# Patient Record
Sex: Male | Born: 1965 | Race: White | Hispanic: No | Marital: Married | Smoking: Never smoker
Health system: Southern US, Community
[De-identification: ages and names within clinical notes are randomized; demographics above are authoritative.]

## PROBLEM LIST (undated history)

## (undated) DIAGNOSIS — I10 Essential (primary) hypertension: Secondary | ICD-10-CM

## (undated) DIAGNOSIS — E7800 Pure hypercholesterolemia, unspecified: Secondary | ICD-10-CM

---

## 2020-01-14 ENCOUNTER — Emergency Department (HOSPITAL_BASED_OUTPATIENT_CLINIC_OR_DEPARTMENT_OTHER): Payer: 59

## 2020-01-14 ENCOUNTER — Encounter (HOSPITAL_BASED_OUTPATIENT_CLINIC_OR_DEPARTMENT_OTHER): Payer: Self-pay | Admitting: Emergency Medicine

## 2020-01-14 ENCOUNTER — Emergency Department (HOSPITAL_BASED_OUTPATIENT_CLINIC_OR_DEPARTMENT_OTHER)
Admission: EM | Admit: 2020-01-14 | Discharge: 2020-01-14 | Disposition: A | Discharge status: Home / Self Care | Payer: 59 | Attending: Emergency Medicine | Admitting: Emergency Medicine

## 2020-01-14 ENCOUNTER — Other Ambulatory Visit: Payer: Self-pay

## 2020-01-14 DIAGNOSIS — Y939 Activity, unspecified: Secondary | ICD-10-CM | POA: Insufficient documentation

## 2020-01-14 DIAGNOSIS — Y999 Unspecified external cause status: Secondary | ICD-10-CM | POA: Insufficient documentation

## 2020-01-14 DIAGNOSIS — I10 Essential (primary) hypertension: Secondary | ICD-10-CM | POA: Insufficient documentation

## 2020-01-14 DIAGNOSIS — Y92018 Other place in single-family (private) house as the place of occurrence of the external cause: Secondary | ICD-10-CM | POA: Insufficient documentation

## 2020-01-14 DIAGNOSIS — S299XXA Unspecified injury of thorax, initial encounter: Secondary | ICD-10-CM | POA: Diagnosis present

## 2020-01-14 DIAGNOSIS — Z79899 Other long term (current) drug therapy: Secondary | ICD-10-CM | POA: Insufficient documentation

## 2020-01-14 DIAGNOSIS — S22080A Wedge compression fracture of T11-T12 vertebra, initial encounter for closed fracture: Secondary | ICD-10-CM | POA: Insufficient documentation

## 2020-01-14 DIAGNOSIS — W001XXA Fall from stairs and steps due to ice and snow, initial encounter: Secondary | ICD-10-CM | POA: Insufficient documentation

## 2020-01-14 HISTORY — DX: Essential (primary) hypertension: I10

## 2020-01-14 HISTORY — DX: Pure hypercholesterolemia, unspecified: E78.00

## 2020-01-14 MED ORDER — CYCLOBENZAPRINE HCL 10 MG PO TABS: 10.0000 mg | ORAL_TABLET | Freq: Two times a day (BID) | ORAL | 0 refills | Status: AC | PRN

## 2020-01-14 MED ORDER — HYDROCODONE-ACETAMINOPHEN 5-325 MG PO TABS: 1.0000 | ORAL_TABLET | Freq: Four times a day (QID) | ORAL | 0 refills | Status: AC | PRN

## 2020-01-14 NOTE — ED Notes (Signed)
ED Provider at bedside. 

## 2020-01-14 NOTE — ED Notes (Signed)
Patient transported to X-ray 

## 2020-01-14 NOTE — ED Provider Notes (Signed)
[facility] EMERGENCY DEPARTMENT Provider Note   CSN: [id number] Arrival date & time: 01/14/20  1045     History Chief Complaint  Patient presents with  . Fall    Paul Anthony is a 54 y.o. male history of hypertension here presenting with fall with back pain.  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  He went back home today and when down the porch and slipped on ice and fell and the brake hit the right side of his back.  He states that he has severe pain afterwards.  He states that he is able to walk and denies any numbness or weakness down his legs.  He denies any trouble urinating.  Denies any head injury or loss of consciousness.  He has a history of hypertension and just took his blood pressure medicine prior to arrival.  He also took some ibuprofen with minimal relief.  He denies any blood in his urine or rib pain or chest pain.  The history is provided by the patient.       Past Medical History:  Diagnosis Date  . High cholesterol   . Hypertension     There are no problems to display for this patient.   History reviewed. No pertinent surgical history.     No family history on file.  Social History   Tobacco Use  . Smoking status: Never Smoker  . Smokeless tobacco: Never Used  Substance Use Topics  . Alcohol use: Not Currently  . Drug use: Not on file    Home Medications Prior to Admission medications   Medication Sig Start Date End Date Taking? Authorizing Provider  atorvastatin (LIPITOR) 40 MG tablet Take 40 mg by mouth daily.   Yes [provider]  lisinopril (ZESTRIL) 20 MG tablet Take 20 mg by mouth daily.   Yes [provider]    Allergies    Patient has no known allergies.  Review of Systems   Review of Systems  Musculoskeletal: Positive for back pain.  All other systems reviewed and are negative.   Physical Exam Updated Vital Signs BP (!) 182/101 (BP  Location: Right Arm)   Pulse 76   Temp 98.1 F (36.7 C) (Oral)   Resp (!) 22   Ht 6' (1.829 m)   Wt 127 kg   SpO2 98%   BMI 37.97 kg/m   Physical Exam Vitals and nursing note reviewed.  HENT:     Head: Normocephalic.     Nose: Nose normal.     Mouth/Throat:     Mouth: Mucous membranes are moist.  Eyes:     Extraocular Movements: Extraocular movements intact.     Pupils: Pupils are equal, round, and reactive to light.  Cardiovascular:     Rate and Rhythm: Normal rate and regular rhythm.     Pulses: Normal pulses.     Heart sounds: Normal heart sounds.  Pulmonary:     Effort: Pulmonary effort is normal.     Breath sounds: Normal breath sounds.     Comments: No rib tenderness  Abdominal:     General: Abdomen is flat.     Palpations: Abdomen is soft.  Musculoskeletal:     Cervical back: Normal range of motion.     Comments: Mild R paralumbar tenderness, no midline spinal tenderness or deformity. No pelvic tenderness, nl ROM bilateral hips.  No saddle anesthesia.  Skin:    General:  Skin is warm.     Capillary Refill: Capillary refill takes less than 2 seconds.  Neurological:     Mental Status: He is alert.     Comments: Patient has normal strength bilateral upper and lower extremities.  There is no saddle anesthesia has normal gait.  Normal reflexes bilateral knees   Psychiatric:        Mood and Affect: Mood normal.     ED Results / Procedures / Treatments   Labs (all labs ordered are listed, but only abnormal results are displayed) Labs Reviewed - No data to display  EKG None  Radiology No results found.  Procedures Procedures (including critical care time)  Medications Ordered in ED Medications - No data to display  ED Course  I have reviewed the triage vital signs and the nursing notes.  Pertinent labs & imaging results that were available during my care of the patient were reviewed by me and considered in my medical decision making (see chart for  details).    MDM Rules/Calculators/A&P                      Paul Anthony is a 54 y.o. male here with back pain after fall.  Mechanical fall down 4 steps and hit his back on the bricks.  Has no loss of consciousness or head injury or chest injury.  Patient is hypertensive but has a history of hypertension.  Patient has nonfocal neuro exam.  I think likely muscle strain given back injury, will get x-ray of the lumbar spine.  No need for MRI currently.  12:04 PM Xray showed possible T12 wedging, unclear if its old or new. He is point tender there so he may have a small compression fracture and he denies any previous falls or known fracture there. He is ambulatory but he appears uncomfortable. He not want any pain medicine here and rather that I prescribe it. Will send him home with some Vicodin for pain and Flexeril for muscle relaxant.  Final Clinical Impression(s) / ED Diagnoses Final diagnoses:  None    Rx / DC Orders ED Discharge Orders    None       Drenda Freeze, MD 01/14/20 1206

## 2020-01-14 NOTE — Discharge Instructions (Addendum)
He may have a small compression fracture in your spine. It is unclear if it is a old or new fracture.  Take Motrin for pain and take Vicodin for severe pain.  You are expected to have muscle spasms so take Flexeril for muscle relaxant.  See neurosurgery for follow up   Return to ER if you have worse back pain, numbness, weakness, trouble walking

## 2020-01-14 NOTE — ED Notes (Signed)
Pt verbalized understanding of dc instructions.

## 2020-01-14 NOTE — ED Triage Notes (Signed)
States he fell down 4 brick steps this morning and the corner of the brick caught his low back.

## 2021-08-03 IMAGING — CR DG LUMBAR SPINE COMPLETE 4+V
5 series · 5 of 5 positions shown · non-contrast
Comparison: None.

CLINICAL DATA: Right-sided low back pain after fall on ice this
morning.

EXAM:
LUMBAR SPINE - COMPLETE 4+ VIEW

[t l-spine oblique exposure (1 of 2)]
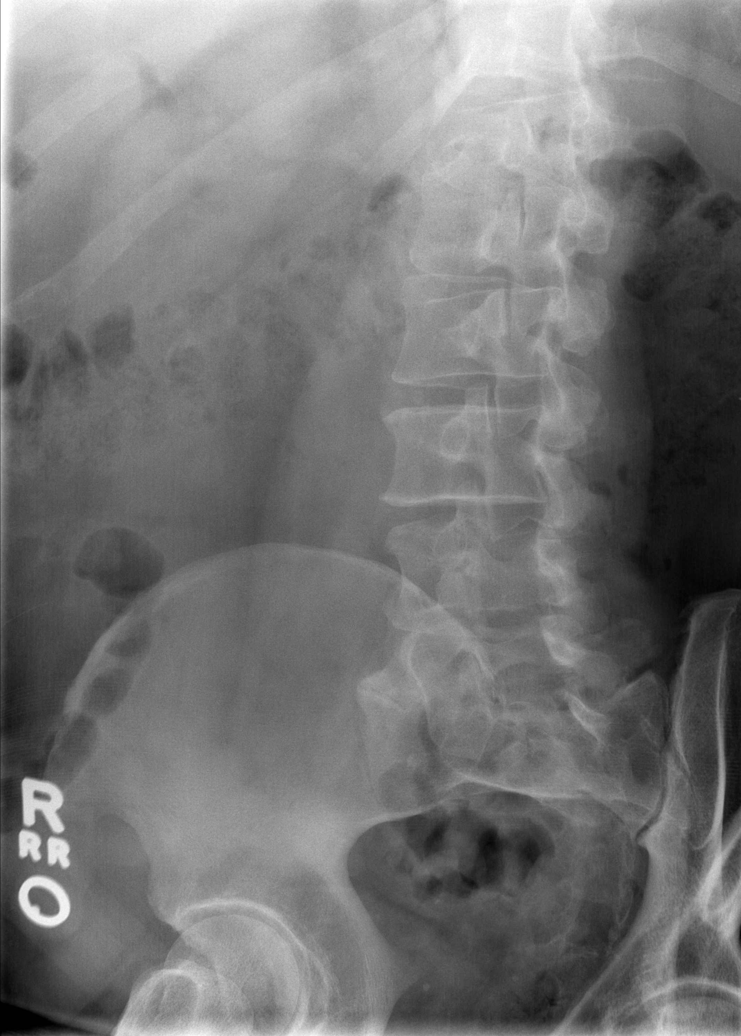

[t l-spine a.p.]
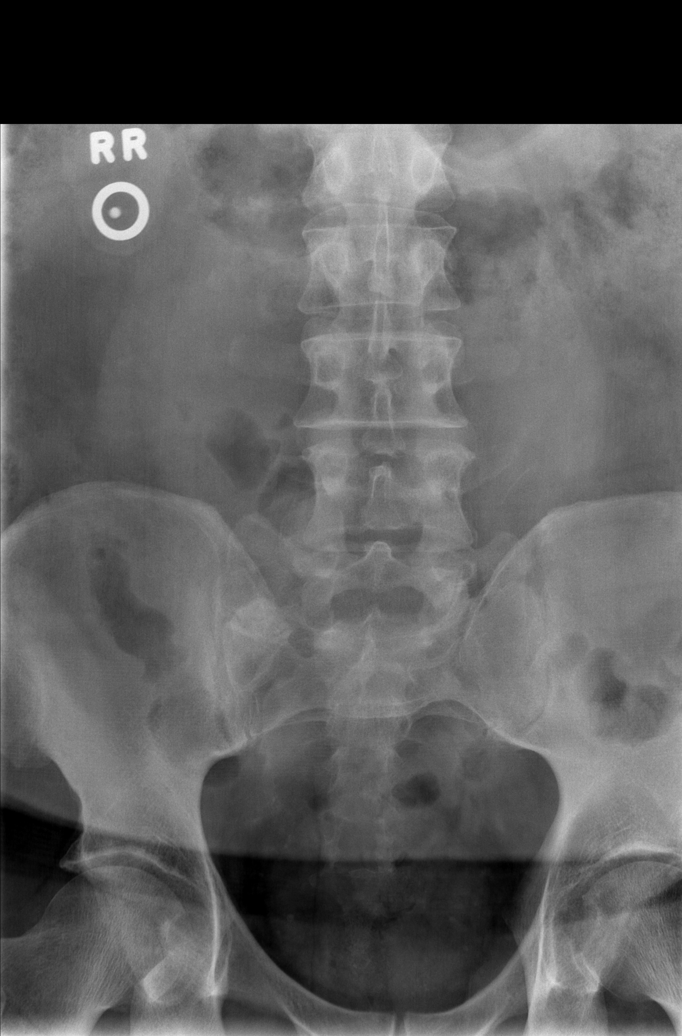

[t l-spine oblique exposure (2 of 2)]
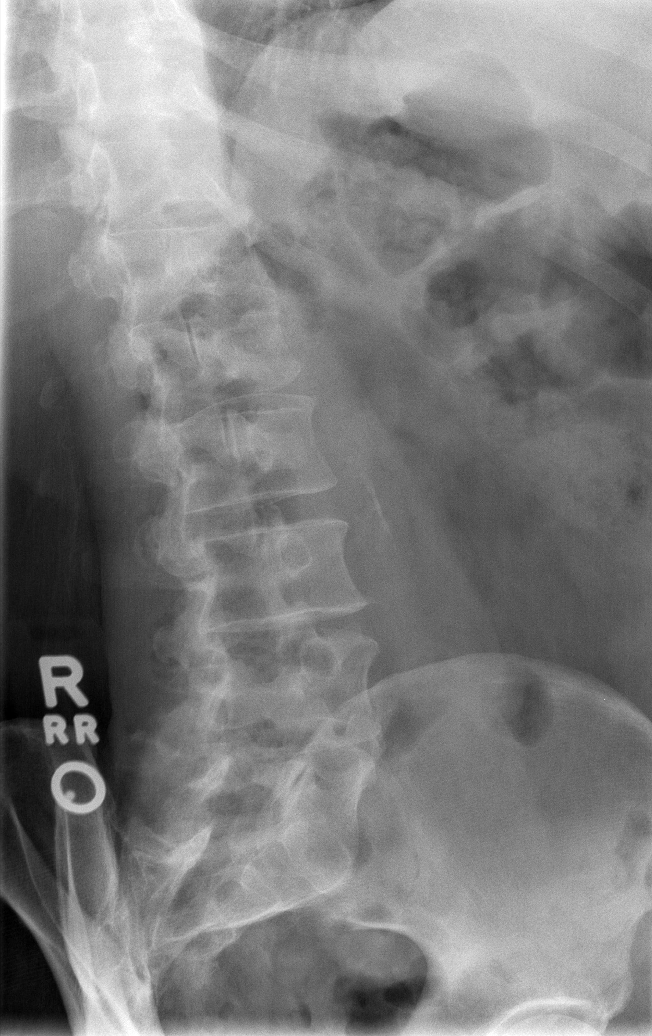

[t l-spine lat]
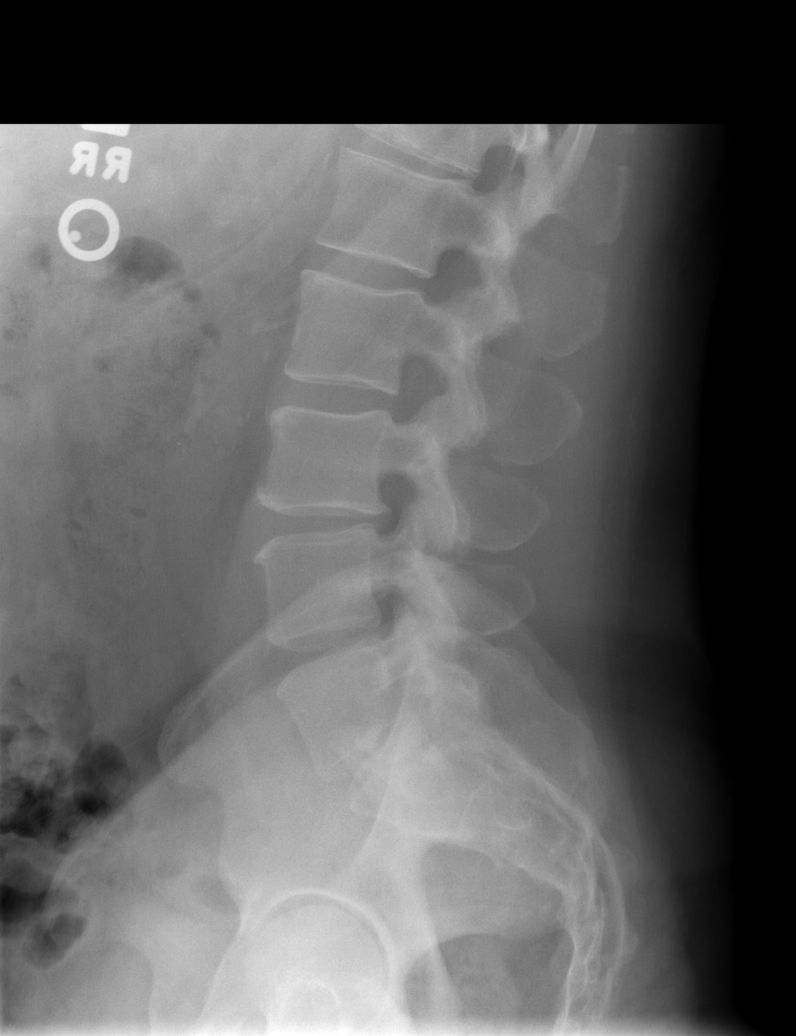

[t l-spine l5-s1 spot]
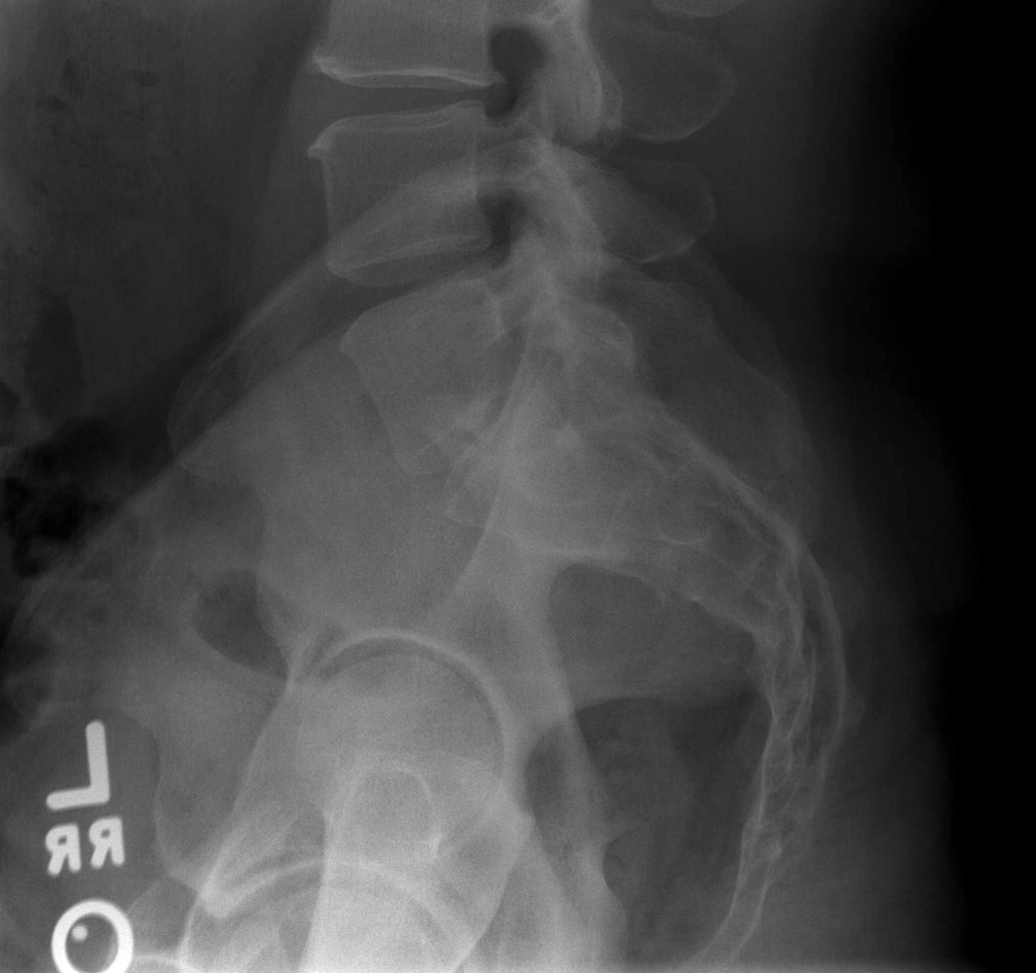

[5 of 5 positions shown; findings below may reference images not displayed]

FINDINGS: Mild wedging of the T12 vertebral body appears chronic, although is
technically age indeterminate. Lumbar vertebral body heights are
maintained without evidence of fracture. Alignment maintained
without traumatic listhesis. Intervertebral disc spaces are
relatively preserved with mild degenerative endplate changes. Mild
lower lumbar facet arthrosis. SI joints are intact.
IMPRESSION: 1. No evidence of acute lumbar spine fracture or subluxation.
2. Mild wedging of the T12 vertebral body appears chronic, although
is technically age indeterminate. Correlation with point tenderness
at this level is recommended.
